# Patient Record
Sex: Male | Born: 1945 | Race: White | Hispanic: No | Marital: Married | Smoking: Never smoker
Health system: Southern US, Community
[De-identification: ages and names within clinical notes are randomized; demographics above are authoritative.]

## PROBLEM LIST (undated history)

## (undated) DIAGNOSIS — N529 Male erectile dysfunction, unspecified: Secondary | ICD-10-CM

## (undated) DIAGNOSIS — K589 Irritable bowel syndrome without diarrhea: Secondary | ICD-10-CM

## (undated) DIAGNOSIS — I10 Essential (primary) hypertension: Secondary | ICD-10-CM

## (undated) DIAGNOSIS — E785 Hyperlipidemia, unspecified: Secondary | ICD-10-CM

## (undated) DIAGNOSIS — M899 Disorder of bone, unspecified: Secondary | ICD-10-CM

## (undated) DIAGNOSIS — F411 Generalized anxiety disorder: Secondary | ICD-10-CM

## (undated) DIAGNOSIS — M949 Disorder of cartilage, unspecified: Secondary | ICD-10-CM

## (undated) DIAGNOSIS — J383 Other diseases of vocal cords: Secondary | ICD-10-CM

## (undated) DIAGNOSIS — D126 Benign neoplasm of colon, unspecified: Secondary | ICD-10-CM

## (undated) DIAGNOSIS — K219 Gastro-esophageal reflux disease without esophagitis: Secondary | ICD-10-CM

## (undated) HISTORY — DX: Disorder of cartilage, unspecified: M94.9

## (undated) HISTORY — DX: Hyperlipidemia, unspecified: E78.5

## (undated) HISTORY — DX: Essential (primary) hypertension: I10

## (undated) HISTORY — DX: Irritable bowel syndrome, unspecified: K58.9

## (undated) HISTORY — PX: CHOLECYSTECTOMY: SHX55

## (undated) HISTORY — PX: TONSILLECTOMY AND ADENOIDECTOMY: SUR1326

## (undated) HISTORY — DX: Generalized anxiety disorder: F41.1

## (undated) HISTORY — DX: Gastro-esophageal reflux disease without esophagitis: K21.9

## (undated) HISTORY — DX: Benign neoplasm of colon, unspecified: D12.6

## (undated) HISTORY — DX: Disorder of bone, unspecified: M89.9

## (undated) HISTORY — DX: Male erectile dysfunction, unspecified: N52.9

## (undated) HISTORY — DX: Other diseases of vocal cords: J38.3

---

## 1998-03-28 ENCOUNTER — Inpatient Hospital Stay (HOSPITAL_COMMUNITY): Admission: EM | Admit: 1998-03-28 | Discharge: 1998-04-01 | Payer: Self-pay | Admitting: Emergency Medicine

## 2001-01-02 ENCOUNTER — Encounter: Payer: Self-pay | Admitting: Pulmonary Disease

## 2001-01-02 ENCOUNTER — Ambulatory Visit (HOSPITAL_COMMUNITY): Admission: RE | Admit: 2001-01-02 | Discharge: 2001-01-02 | Payer: Self-pay | Admitting: Pulmonary Disease

## 2001-02-09 ENCOUNTER — Encounter (INDEPENDENT_AMBULATORY_CARE_PROVIDER_SITE_OTHER): Payer: Self-pay | Admitting: Specialist

## 2001-02-09 ENCOUNTER — Observation Stay (HOSPITAL_COMMUNITY): Admission: RE | Admit: 2001-02-09 | Discharge: 2001-02-10 | Payer: Self-pay | Admitting: General Surgery

## 2004-12-22 ENCOUNTER — Emergency Department (HOSPITAL_COMMUNITY): Admission: EM | Admit: 2004-12-22 | Discharge: 2004-12-23 | Payer: Self-pay | Admitting: Emergency Medicine

## 2004-12-25 ENCOUNTER — Ambulatory Visit: Payer: Self-pay | Admitting: Pulmonary Disease

## 2004-12-31 ENCOUNTER — Ambulatory Visit: Payer: Self-pay

## 2005-04-23 ENCOUNTER — Ambulatory Visit: Payer: Self-pay | Admitting: Pulmonary Disease

## 2006-06-28 ENCOUNTER — Ambulatory Visit: Payer: Self-pay | Admitting: Pulmonary Disease

## 2007-05-22 ENCOUNTER — Ambulatory Visit: Payer: Self-pay | Admitting: Pulmonary Disease

## 2007-05-23 LAB — CONVERTED CEMR LAB
ALT: 27 units/L (ref 0–53)
AST: 26 units/L (ref 0–37)
Albumin: 3.9 g/dL (ref 3.5–5.2)
Alkaline Phosphatase: 70 units/L (ref 39–117)
BUN: 10 mg/dL (ref 6–23)
Basophils Absolute: 0 10*3/uL (ref 0.0–0.1)
Basophils Relative: 0.8 % (ref 0.0–1.0)
Bilirubin Urine: NEGATIVE
Bilirubin, Direct: 0.1 mg/dL (ref 0.0–0.3)
CO2: 32 meq/L (ref 19–32)
Calcium: 9.4 mg/dL (ref 8.4–10.5)
Chloride: 106 meq/L (ref 96–112)
Cholesterol: 186 mg/dL (ref 0–200)
Creatinine, Ser: 0.7 mg/dL (ref 0.4–1.5)
Eosinophils Absolute: 0.2 10*3/uL (ref 0.0–0.6)
Eosinophils Relative: 4.5 % (ref 0.0–5.0)
GFR calc Af Amer: 149 mL/min
GFR calc non Af Amer: 123 mL/min
Glucose, Bld: 99 mg/dL (ref 70–99)
HCT: 38.7 % — ABNORMAL LOW (ref 39.0–52.0)
HDL: 40.4 mg/dL (ref >39.0)
Hemoglobin, Urine: NEGATIVE
Hemoglobin: 13.6 g/dL (ref 13.0–17.0)
Ketones, ur: NEGATIVE mg/dL
LDL Cholesterol: 120 mg/dL — ABNORMAL HIGH (ref 0–99)
Leukocytes, UA: NEGATIVE
Lymphocytes Relative: 26.4 % (ref 12.0–46.0)
MCHC: 35.2 g/dL (ref 30.0–36.0)
MCV: 87.9 fL (ref 78.0–100.0)
Monocytes Absolute: 0.4 10*3/uL (ref 0.2–0.7)
Monocytes Relative: 7.2 % (ref 3.0–11.0)
Neutro Abs: 3.4 10*3/uL (ref 1.4–7.7)
Neutrophils Relative %: 61.1 % (ref 43.0–77.0)
Nitrite: NEGATIVE
PSA: 0.72 ng/mL (ref 0.10–4.00)
Platelets: 308 10*3/uL (ref 150–400)
Potassium: 4.3 meq/L (ref 3.5–5.1)
RBC: 4.4 M/uL (ref 4.22–5.81)
RDW: 12.5 % (ref 11.5–14.6)
Sodium: 142 meq/L (ref 135–145)
Specific Gravity, Urine: 1.015 (ref 1.000–1.03)
TSH: 0.68 microintl units/mL (ref 0.35–5.50)
Total Bilirubin: 0.7 mg/dL (ref 0.3–1.2)
Total CHOL/HDL Ratio: 4.6
Total Protein, Urine: NEGATIVE mg/dL
Total Protein: 7.1 g/dL (ref 6.0–8.3)
Triglycerides: 129 mg/dL (ref 0–149)
Urine Glucose: NEGATIVE mg/dL
Urobilinogen, UA: 0.2 (ref 0.0–1.0)
VLDL: 26 mg/dL (ref 0–40)
WBC: 5.4 10*3/uL (ref 4.5–10.5)
pH: 7 (ref 5.0–8.0)

## 2008-04-15 DIAGNOSIS — K219 Gastro-esophageal reflux disease without esophagitis: Secondary | ICD-10-CM | POA: Insufficient documentation

## 2008-04-15 DIAGNOSIS — K589 Irritable bowel syndrome without diarrhea: Secondary | ICD-10-CM | POA: Insufficient documentation

## 2008-04-15 DIAGNOSIS — F411 Generalized anxiety disorder: Secondary | ICD-10-CM | POA: Insufficient documentation

## 2008-04-15 DIAGNOSIS — I10 Essential (primary) hypertension: Secondary | ICD-10-CM | POA: Insufficient documentation

## 2008-04-15 DIAGNOSIS — D126 Benign neoplasm of colon, unspecified: Secondary | ICD-10-CM | POA: Insufficient documentation

## 2008-04-16 ENCOUNTER — Ambulatory Visit: Payer: Self-pay | Admitting: Pulmonary Disease

## 2008-04-17 DIAGNOSIS — J383 Other diseases of vocal cords: Secondary | ICD-10-CM | POA: Insufficient documentation

## 2008-08-16 ENCOUNTER — Telehealth: Payer: Self-pay | Admitting: Pulmonary Disease

## 2008-08-28 ENCOUNTER — Telehealth (INDEPENDENT_AMBULATORY_CARE_PROVIDER_SITE_OTHER): Payer: Self-pay | Admitting: *Deleted

## 2008-09-05 ENCOUNTER — Encounter: Payer: Self-pay | Admitting: Pulmonary Disease

## 2008-09-10 ENCOUNTER — Encounter: Payer: Self-pay | Admitting: Pulmonary Disease

## 2008-10-01 ENCOUNTER — Telehealth: Payer: Self-pay | Admitting: Pulmonary Disease

## 2008-10-02 ENCOUNTER — Telehealth: Payer: Self-pay | Admitting: Gastroenterology

## 2008-10-03 ENCOUNTER — Encounter: Payer: Self-pay | Admitting: Pulmonary Disease

## 2008-10-18 IMAGING — CR DG CHEST 2V
2 series · 2 of 2 positions shown · non-contrast
Comparison: Scanned chest radiographs 05/29/2003.

CHEST - 2 VIEW
CLINICAL DATA: Cough.

[view not recorded (1 of 2)]
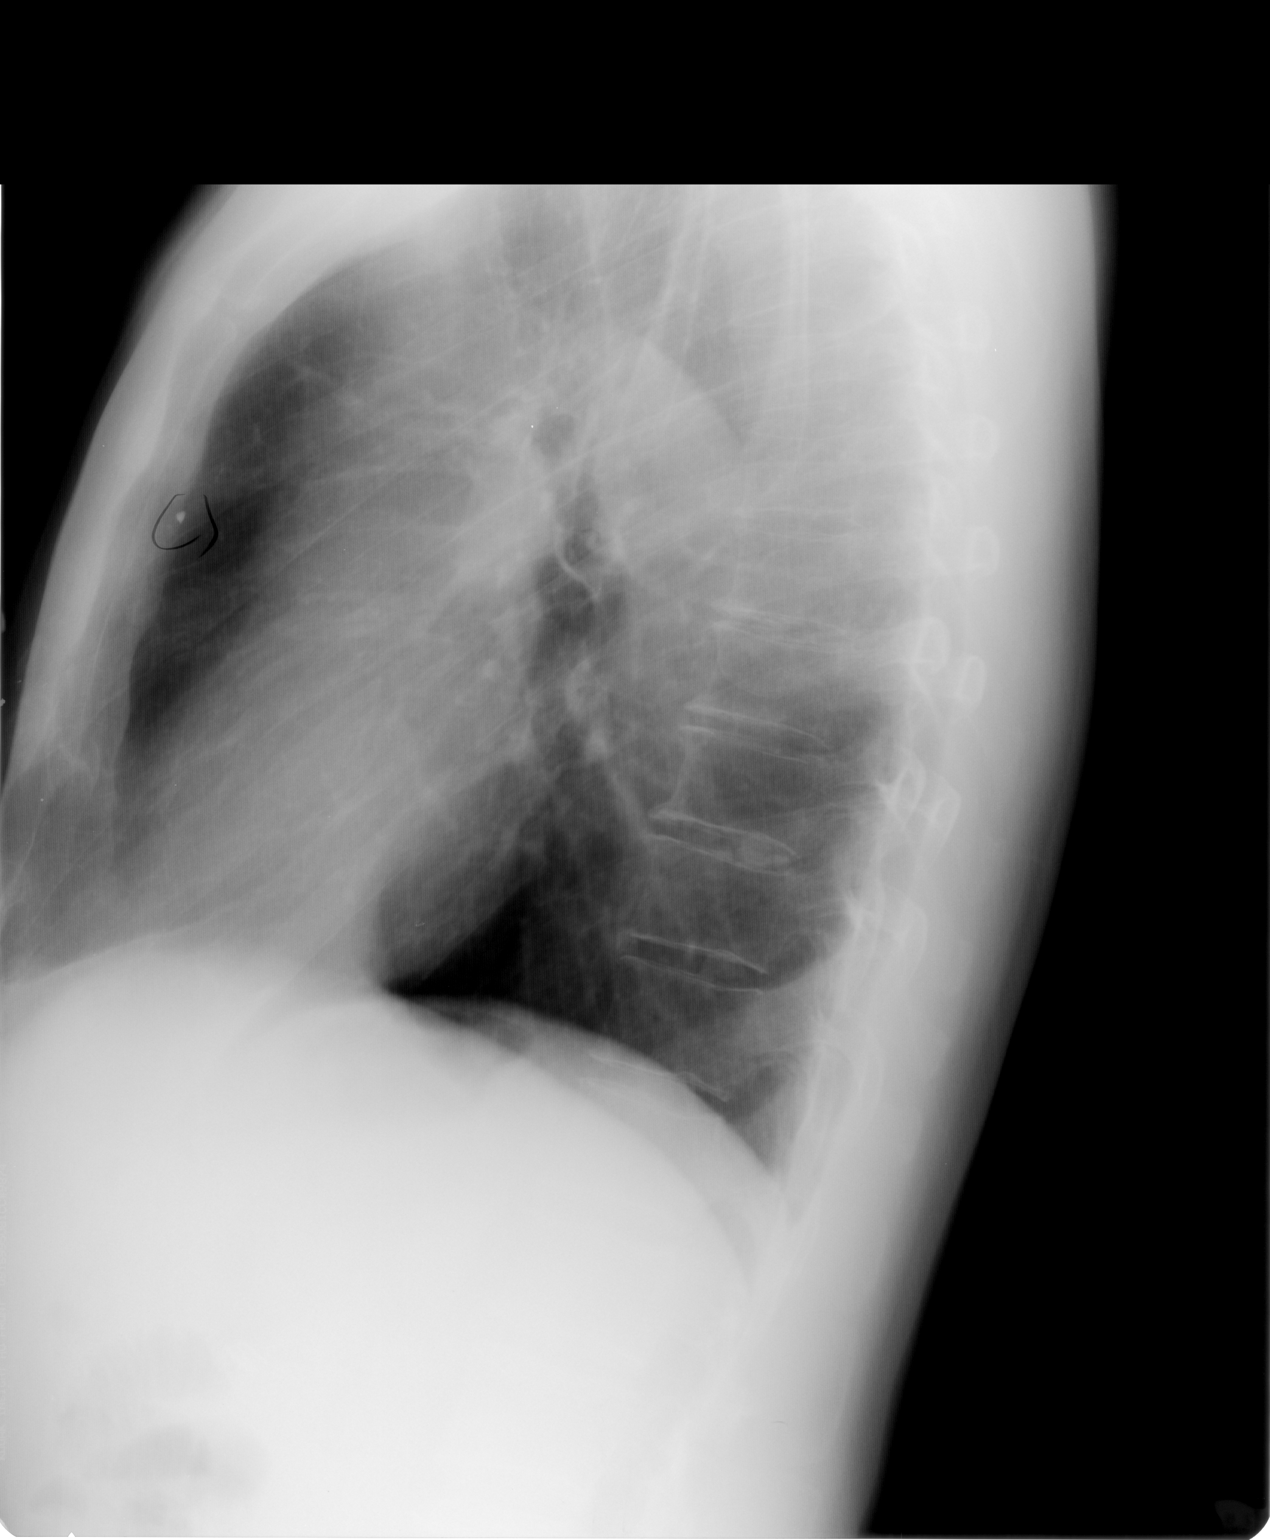

[view not recorded (2 of 2)]
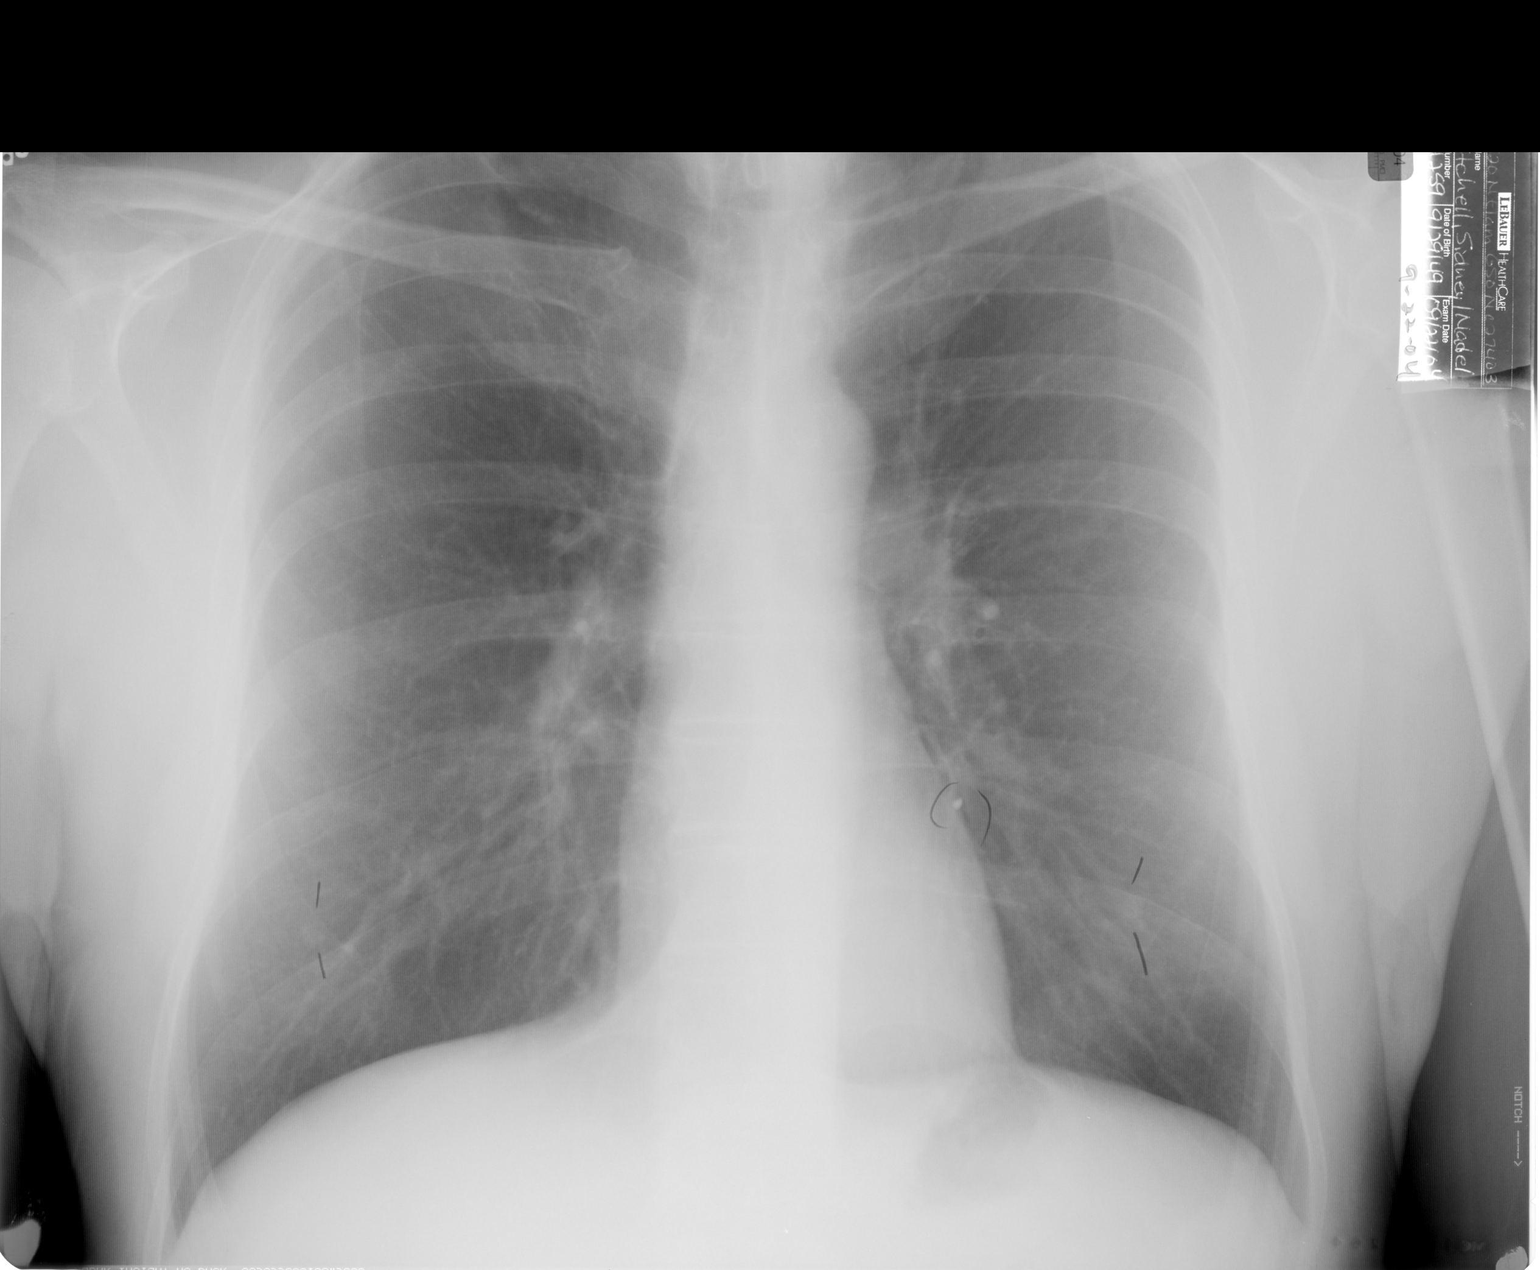

[2 of 2 positions shown; findings below may reference images not displayed]

FINDINGS: Hyperinflation suggesting emphysematous change. Prominent bilateral
nipple shadows are present, as were seen on the prior exam. Punctate
calcification noted adjacent to left heart border, consistent with calcified
granuloma. New airspace disease or edema is identified. Mid to lower thoracic
spine disc calcifications.
**********************************************************************
IMPRESSION

1. No active cardiopulmonary disease.
2. Hyperinflation suggesting emphysematous change.
3. Old granulomatous disease and prominent nipple shadows. 
**********************************************************************

## 2008-11-26 ENCOUNTER — Encounter: Payer: Self-pay | Admitting: Pulmonary Disease

## 2008-12-09 ENCOUNTER — Telehealth: Payer: Self-pay | Admitting: Pulmonary Disease

## 2008-12-11 ENCOUNTER — Encounter: Payer: Self-pay | Admitting: Pulmonary Disease

## 2008-12-31 ENCOUNTER — Encounter: Payer: Self-pay | Admitting: Pulmonary Disease

## 2009-02-04 HISTORY — PX: HEMORROIDECTOMY: SUR656

## 2009-03-27 ENCOUNTER — Encounter: Payer: Self-pay | Admitting: Pulmonary Disease

## 2009-04-07 ENCOUNTER — Telehealth: Payer: Self-pay | Admitting: Pulmonary Disease

## 2009-09-04 ENCOUNTER — Ambulatory Visit: Payer: Self-pay | Admitting: Pulmonary Disease

## 2009-09-05 DIAGNOSIS — M949 Disorder of cartilage, unspecified: Secondary | ICD-10-CM

## 2009-09-05 DIAGNOSIS — E785 Hyperlipidemia, unspecified: Secondary | ICD-10-CM | POA: Insufficient documentation

## 2009-09-05 DIAGNOSIS — M899 Disorder of bone, unspecified: Secondary | ICD-10-CM | POA: Insufficient documentation

## 2009-09-05 DIAGNOSIS — N529 Male erectile dysfunction, unspecified: Secondary | ICD-10-CM

## 2009-09-08 ENCOUNTER — Ambulatory Visit: Payer: Self-pay | Admitting: Pulmonary Disease

## 2009-09-08 LAB — CONVERTED CEMR LAB: Vit D, 25-Hydroxy: 48 ng/mL (ref 30–89)

## 2009-09-16 LAB — CONVERTED CEMR LAB
ALT: 34 units/L (ref 0–53)
AST: 28 units/L (ref 0–37)
Albumin: 3.8 g/dL (ref 3.5–5.2)
Alkaline Phosphatase: 61 units/L (ref 39–117)
BUN: 13 mg/dL (ref 6–23)
Basophils Absolute: 0.1 10*3/uL (ref 0.0–0.1)
Basophils Relative: 0.9 % (ref 0.0–3.0)
Bilirubin, Direct: 0.1 mg/dL (ref 0.0–0.3)
CO2: 30 meq/L (ref 19–32)
Calcium: 9.5 mg/dL (ref 8.4–10.5)
Chloride: 108 meq/L (ref 96–112)
Cholesterol: 146 mg/dL (ref 0–200)
Creatinine, Ser: 0.8 mg/dL (ref 0.4–1.5)
Eosinophils Absolute: 0.4 10*3/uL (ref 0.0–0.7)
Eosinophils Relative: 6.5 % — ABNORMAL HIGH (ref 0.0–5.0)
GFR calc non Af Amer: 104.36 mL/min (ref >60)
Glucose, Bld: 102 mg/dL — ABNORMAL HIGH (ref 70–99)
HCT: 40.4 % (ref 39.0–52.0)
HDL: 35.7 mg/dL — ABNORMAL LOW (ref >39.00)
Hemoglobin: 13.7 g/dL (ref 13.0–17.0)
LDL Cholesterol: 89 mg/dL (ref 0–99)
Lymphocytes Relative: 26.3 % (ref 12.0–46.0)
Lymphs Abs: 1.5 10*3/uL (ref 0.7–4.0)
MCHC: 34 g/dL (ref 30.0–36.0)
MCV: 91.3 fL (ref 78.0–100.0)
Monocytes Absolute: 0.4 10*3/uL (ref 0.1–1.0)
Monocytes Relative: 7.5 % (ref 3.0–12.0)
Neutro Abs: 3.2 10*3/uL (ref 1.4–7.7)
Neutrophils Relative %: 58.8 % (ref 43.0–77.0)
PSA: 0.65 ng/mL (ref 0.10–4.00)
Platelets: 252 10*3/uL (ref 150.0–400.0)
Potassium: 4.4 meq/L (ref 3.5–5.1)
RBC: 4.42 M/uL (ref 4.22–5.81)
RDW: 12.5 % (ref 11.5–14.6)
Sodium: 143 meq/L (ref 135–145)
TSH: 0.74 microintl units/mL (ref 0.35–5.50)
Testosterone: 415.21 ng/dL (ref 350.00–890.00)
Total Bilirubin: 0.8 mg/dL (ref 0.3–1.2)
Total CHOL/HDL Ratio: 4
Total Protein: 6.9 g/dL (ref 6.0–8.3)
Triglycerides: 109 mg/dL (ref 0.0–149.0)
VLDL: 21.8 mg/dL (ref 0.0–40.0)
WBC: 5.6 10*3/uL (ref 4.5–10.5)

## 2009-12-30 ENCOUNTER — Encounter: Payer: Self-pay | Admitting: Pulmonary Disease

## 2010-05-12 ENCOUNTER — Encounter: Payer: Self-pay | Admitting: Pulmonary Disease

## 2010-10-06 NOTE — Consult Note (Signed)
Summary: Otolaryngology/[facility]  Otolaryngology/[facility]   Imported By: Sherian Rein 01/09/2010 09:31:37  _____________________________________________________________________  External Attachment:    Type:   Image     Comment:   External Document

## 2010-10-06 NOTE — Letter (Signed)
Summary: [facility]   Imported By: Lester Aucilla 05/19/2010 11:04:33  _____________________________________________________________________  External Attachment:    Type:   Image     Comment:   External Document

## 2010-10-06 NOTE — Therapy (Signed)
Summary: [facility]  [facility]   Imported By: Lanelle Bal 09/11/2009 13:37:21  _____________________________________________________________________  External Attachment:    Type:   Image     Comment:   External Document

## 2010-12-02 ENCOUNTER — Other Ambulatory Visit: Payer: Self-pay | Admitting: *Deleted

## 2010-12-02 ENCOUNTER — Telehealth: Payer: Self-pay | Admitting: Pulmonary Disease

## 2010-12-02 NOTE — Telephone Encounter (Signed)
Called and spoke with pt's spouse.  She states that she feels like pt may have hernia in his lower abdomen.  She states that the area is "bulging" sometimes and is painful.  Does not occur all the time.  No other complaints. Appt with TP sched for 10 am tomorrow. I advised UC or ER sooner if needed.

## 2010-12-02 NOTE — Telephone Encounter (Signed)
Atenolol DENIED. Patient needs an appointment. Last OV w/ SN 09/04/2009.

## 2010-12-03 ENCOUNTER — Encounter: Payer: Self-pay | Admitting: Adult Health

## 2010-12-03 ENCOUNTER — Ambulatory Visit (INDEPENDENT_AMBULATORY_CARE_PROVIDER_SITE_OTHER): Payer: 59 | Admitting: Adult Health

## 2010-12-03 VITALS — BP 114/74 | HR 50 | Temp 97.4°F | Ht 71.0 in | Wt 186.4 lb

## 2010-12-03 DIAGNOSIS — K4090 Unilateral inguinal hernia, without obstruction or gangrene, not specified as recurrent: Secondary | ICD-10-CM | POA: Insufficient documentation

## 2010-12-03 NOTE — Patient Instructions (Signed)
We are referring you to a surgeon to evaluate your left inguinal hernia follow up Dr. Kriste Basque  In 3 months for follow up

## 2010-12-03 NOTE — Progress Notes (Signed)
  Subjective:    Patient ID: Thomas Ward, male    DOB: May 01, 1946, 65 y.o.   MRN: [id number]  HPI Comments: 65 yo Male with known hx of HTN, IBS, GERD   12/03/2010 ACUTE OV- Presents with complaints of ? right femoral hernia x1months - reports has become larger since onset. c/o discomfort, but no pain. Noticed some discomfort and saw saw swelling along left groin. Over last 2 months has seen it get bigger.  Wants a referral to Gen Surgeon at [facility]. He did his previous hemorrhoid sugery.  No pain, dysuria, no testicular pain,. He does a lot of heavy lifting. Has started new exercise regimen for last 2 months.       Review of Systems   Constitutional:   No  weight loss, night sweats,  Fevers, chills, fatigue, lassitude. HEENT:   No headaches,  Difficulty swallowing,   CV:  No chest pain,  Orthopnea, PND, swelling in lower extremities, anasarca, dizziness, palpitations  GI  No heartburn, indigestion, abdominal pain, nausea, vomiting, diarrhea, change in bowel habits, loss of appetite  Resp: No shortness of breath with exertion or at rest.  No excess mucus, no productive cough,  No non-productive cough,  No coughing up of blood.  No change in color of mucus.  No wheezing.  No chest wall deformity  Skin: no rash or lesions.  GU: no dysuria, change in color of urine, no urgency or frequency.  No flank pain. postive pressure in left groin, swelling -intermittent w/ bending.   MS:  No joint pain or swelling.  No decreased range of motion.  No back pain.  Psych:  No change in mood or affect. No depression or anxiety.  No memory loss.   Objective:   Physical Exam Gen: Pleasant, well-nourished, in no distress,  normal affect  ENT: No lesions,  mouth clear,  oropharynx clear, no postnasal drip  Neck: No JVD, no TMG, no carotid bruits  Lungs: No use of accessory muscles, no dullness to percussion, clear without rales or rhonchi  Cardiovascular: RRR, heart  sounds normal, no murmur or gallops, no peripheral edema  Abdomen: soft and NT, no HSM,  BS normal Right groin no palpable masses noted, pulses intact w/o bruits.  Left groin with palpable soft mass that is reducible  No testicular nodules/masses noted, no swelling. Palpable hernia in left ingunial canal.   Musculoskeletal: No deformities, no cyanosis or clubbing  Neuro: alert, non focal  Skin: Warm, no lesions or rashes         Assessment & Plan:

## 2010-12-03 NOTE — Assessment & Plan Note (Signed)
Left inguinal hernia - non-incarcerated  -plan Refer to gen surgery for eval.  Avoid heavy lifting or straining.

## 2010-12-04 ENCOUNTER — Ambulatory Visit: Payer: Self-pay | Admitting: Adult Health

## 2010-12-28 ENCOUNTER — Other Ambulatory Visit: Payer: Self-pay | Admitting: *Deleted

## 2010-12-28 MED ORDER — AMLODIPINE BESYLATE 5 MG PO TABS: 5.0000 mg | ORAL_TABLET | Freq: Every day | ORAL | Status: DC

## 2010-12-28 MED ORDER — ATENOLOL 50 MG PO TABS: 50.0000 mg | ORAL_TABLET | Freq: Every day | ORAL | Status: DC

## 2011-01-20 ENCOUNTER — Encounter: Payer: Self-pay | Admitting: Pulmonary Disease

## 2011-01-22 NOTE — H&P (Signed)
[facility]  Patient:    Thomas Ward, Thomas Ward                    MRN: [id number] Adm. Date:  [id number] Attending:  Arlis Porta CC:         Lonzo Cloud. Kriste Basque, M.D. [facility]                         History and Physical  REASON FOR ADMISSION:  Elective cholecystectomy.  HISTORY OF PRESENT ILLNESS:  Thomas Ward is a 65 year old male who I saw in the office on January 02, 2001.  At that time, he had had six days of headache, lethargy, fever and chills and crampy abdominal pain with nausea and then diarrhea; it occurred after he ate a meal at [facility]  He was seen by Dr. Lonzo Cloud. Kriste Basque and started on Nexium and he also underwent an abdominal ultrasound which demonstrated a 1-cm gallstone.  At that time, the current acute episode he was having seemed to be more consistent with a gastroenteritis; however, in speaking with him, he did describe intermittent symptoms in the past of postprandial crampy right upper quadrant pain associated with some nausea.  The postprandial pain that he had had in the past was different than the current bout he was having when I saw him in the office.  He subsequently improved with some hydration.  Because he has had biliary colic and gallstones, he now presents for elective cholecystectomy. He has normal liver function tests and amylase and CBC.  The procedure and the risks including but not limited to bleeding, infection, common bile duct injury, bile leak, intestinal injury and risks of anesthesia were explained to him; we also talked about the side-effects of diarrhea.  PAST MEDICAL HISTORY:  Gastroesophageal reflux with laryngitis.  PREVIOUS OPERATIONS:  None.  ALLERGIES:  None.  MEDICATIONS:  Nexium.  SOCIAL HISTORY:  No tobacco use.  He has two alcoholic beverages a month.  FAMILY HISTORY:  No chronic illnesses in his family.  REVIEW OF SYSTEMS:  Remarkable for some laryngeal spasms.  PHYSICAL  EXAMINATION:  GENERAL:  A well-developed, well-nourished male in no acute distress, pleasant and cooperative.  EYES:  Extraocular motions intact.  Sclerae are clear.  NECK:  Supple without masses.  ABDOMEN:  Soft, nontender, nondistended.  No palpable masses.  LUNGS:  Equal breath sounds and clear to auscultation.  HEART:  Regular rate and rhythm.  EXTREMITIES:  Full range of motion.  No edema.  IMPRESSION:  Biliary colic secondary to cholelithiasis.  PLAN:  Laparoscopic cholecystectomy. DD:  02/09/01 TD:  02/10/01 Job: [id number]

## 2011-03-12 ENCOUNTER — Other Ambulatory Visit: Payer: Self-pay | Admitting: *Deleted

## 2011-03-12 MED ORDER — PANTOPRAZOLE SODIUM 40 MG PO TBEC: 40.0000 mg | DELAYED_RELEASE_TABLET | Freq: Every day | ORAL | Status: DC

## 2011-03-17 ENCOUNTER — Other Ambulatory Visit: Payer: Self-pay | Admitting: *Deleted

## 2011-03-17 MED ORDER — AMLODIPINE BESYLATE 5 MG PO TABS: ORAL_TABLET | ORAL | Status: AC

## 2011-03-17 MED ORDER — PANTOPRAZOLE SODIUM 40 MG PO TBEC: DELAYED_RELEASE_TABLET | ORAL | Status: AC

## 2011-03-17 MED ORDER — ATENOLOL 50 MG PO TABS: ORAL_TABLET | ORAL | Status: AC

## 2011-03-17 NOTE — Telephone Encounter (Signed)
Received faxed refill request for amlodipine 5mg  daily and pantoprazole 40 daily - pt requesting 90 days supply.  Pt was last seen by SN on 08/25/2009 and TP on 12/03/2010 - no pending appts.  Called, spoke with pt.  He is aware 90 day rx will be sent for each med but he will need to make OV with SN for additional refills.  He verbalized understanding of this and is requesting to call back later to schedule cpx with SN.

## 2011-03-17 NOTE — Telephone Encounter (Signed)
Pt's last OV with SN 09/04/2009 Last OV with TP 12/03/2010 No pending appts.

## 2015-06-02 ENCOUNTER — Other Ambulatory Visit: Payer: Self-pay | Admitting: Orthopedic Surgery

## 2015-06-02 ENCOUNTER — Other Ambulatory Visit: Payer: Self-pay

## 2015-06-02 ENCOUNTER — Encounter (HOSPITAL_BASED_OUTPATIENT_CLINIC_OR_DEPARTMENT_OTHER): Payer: Self-pay | Admitting: *Deleted

## 2015-06-02 ENCOUNTER — Encounter (HOSPITAL_BASED_OUTPATIENT_CLINIC_OR_DEPARTMENT_OTHER)
Admission: RE | Admit: 2015-06-02 | Discharge: 2015-06-02 | Disposition: A | Discharge status: Home / Self Care | Payer: Medicare Other | Source: Ambulatory Visit | Attending: Orthopedic Surgery | Admitting: Orthopedic Surgery

## 2015-06-02 DIAGNOSIS — Z7982 Long term (current) use of aspirin: Secondary | ICD-10-CM | POA: Diagnosis not present

## 2015-06-02 DIAGNOSIS — I10 Essential (primary) hypertension: Secondary | ICD-10-CM | POA: Diagnosis not present

## 2015-06-02 DIAGNOSIS — Z01818 Encounter for other preprocedural examination: Secondary | ICD-10-CM | POA: Insufficient documentation

## 2015-06-02 DIAGNOSIS — E785 Hyperlipidemia, unspecified: Secondary | ICD-10-CM | POA: Diagnosis not present

## 2015-06-02 DIAGNOSIS — D2112 Benign neoplasm of connective and other soft tissue of left upper limb, including shoulder: Secondary | ICD-10-CM | POA: Diagnosis not present

## 2015-06-02 DIAGNOSIS — M6028 Foreign body granuloma of soft tissue, not elsewhere classified, other site: Secondary | ICD-10-CM | POA: Diagnosis not present

## 2015-06-02 DIAGNOSIS — Z1833 Retained wood fragments: Secondary | ICD-10-CM | POA: Diagnosis not present

## 2015-06-03 ENCOUNTER — Encounter (HOSPITAL_BASED_OUTPATIENT_CLINIC_OR_DEPARTMENT_OTHER)
Admission: RE | Disposition: A | Discharge status: Home / Self Care | Payer: Self-pay | Source: Ambulatory Visit | Attending: Orthopedic Surgery

## 2015-06-03 ENCOUNTER — Ambulatory Visit (HOSPITAL_BASED_OUTPATIENT_CLINIC_OR_DEPARTMENT_OTHER): Payer: Medicare Other | Admitting: Anesthesiology

## 2015-06-03 ENCOUNTER — Ambulatory Visit (HOSPITAL_BASED_OUTPATIENT_CLINIC_OR_DEPARTMENT_OTHER)
Admission: RE | Admit: 2015-06-03 | Discharge: 2015-06-03 | Disposition: A | Discharge status: Home / Self Care | Payer: Medicare Other | Source: Ambulatory Visit | Attending: Orthopedic Surgery | Admitting: Orthopedic Surgery

## 2015-06-03 ENCOUNTER — Encounter (HOSPITAL_BASED_OUTPATIENT_CLINIC_OR_DEPARTMENT_OTHER): Payer: Self-pay | Admitting: Orthopedic Surgery

## 2015-06-03 DIAGNOSIS — Z1833 Retained wood fragments: Secondary | ICD-10-CM | POA: Diagnosis not present

## 2015-06-03 DIAGNOSIS — E785 Hyperlipidemia, unspecified: Secondary | ICD-10-CM | POA: Insufficient documentation

## 2015-06-03 DIAGNOSIS — M6028 Foreign body granuloma of soft tissue, not elsewhere classified, other site: Secondary | ICD-10-CM | POA: Insufficient documentation

## 2015-06-03 DIAGNOSIS — Z7982 Long term (current) use of aspirin: Secondary | ICD-10-CM | POA: Insufficient documentation

## 2015-06-03 DIAGNOSIS — I10 Essential (primary) hypertension: Secondary | ICD-10-CM | POA: Insufficient documentation

## 2015-06-03 HISTORY — PX: MASS EXCISION: SHX2000

## 2015-06-03 SURGERY — EXCISION MASS
Anesthesia: Regional | Site: Finger | Laterality: Left

## 2015-06-03 MED ORDER — LIDOCAINE HCL (CARDIAC) 20 MG/ML IV SOLN
INTRAVENOUS | Status: DC | PRN
  Administered 2015-06-03: 30 mg via INTRAVENOUS

## 2015-06-03 MED ORDER — CEFAZOLIN SODIUM-DEXTROSE 2-3 GM-% IV SOLR
INTRAVENOUS | Status: AC
  Filled 2015-06-03: qty 50

## 2015-06-03 MED ORDER — FENTANYL CITRATE (PF) 100 MCG/2ML IJ SOLN
INTRAMUSCULAR | Status: DC | PRN
  Administered 2015-06-03: 50 ug via INTRAVENOUS

## 2015-06-03 MED ORDER — LACTATED RINGERS IV SOLN
INTRAVENOUS | Status: DC | PRN
  Administered 2015-06-03 (×2): via INTRAVENOUS

## 2015-06-03 MED ORDER — BUPIVACAINE HCL (PF) 0.25 % IJ SOLN
INTRAMUSCULAR | Status: DC | PRN
  Administered 2015-06-03: 3 mL

## 2015-06-03 MED ORDER — MIDAZOLAM HCL 2 MG/2ML IJ SOLN: 0.5000 mg | Freq: Once | INTRAMUSCULAR | Status: DC | PRN

## 2015-06-03 MED ORDER — CHLORHEXIDINE GLUCONATE 4 % EX LIQD: 60.0000 mL | Freq: Once | CUTANEOUS | Status: DC

## 2015-06-03 MED ORDER — MIDAZOLAM HCL 5 MG/5ML IJ SOLN
INTRAMUSCULAR | Status: DC | PRN
  Administered 2015-06-03: 1 mg via INTRAVENOUS

## 2015-06-03 MED ORDER — CEFAZOLIN SODIUM-DEXTROSE 2-3 GM-% IV SOLR
2.0000 g | INTRAVENOUS | Status: AC
  Administered 2015-06-03: 2 g via INTRAVENOUS

## 2015-06-03 MED ORDER — TRAMADOL HCL 50 MG PO TABS: 50.0000 mg | ORAL_TABLET | Freq: Four times a day (QID) | ORAL | Status: AC | PRN

## 2015-06-03 MED ORDER — FENTANYL CITRATE (PF) 100 MCG/2ML IJ SOLN
INTRAMUSCULAR | Status: AC
  Filled 2015-06-03: qty 4

## 2015-06-03 MED ORDER — CEPHALEXIN 250 MG PO CAPS: 250.0000 mg | ORAL_CAPSULE | Freq: Four times a day (QID) | ORAL | Status: AC

## 2015-06-03 MED ORDER — MEPERIDINE HCL 25 MG/ML IJ SOLN: 6.2500 mg | INTRAMUSCULAR | Status: DC | PRN

## 2015-06-03 MED ORDER — PROMETHAZINE HCL 25 MG/ML IJ SOLN: 6.2500 mg | INTRAMUSCULAR | Status: DC | PRN

## 2015-06-03 MED ORDER — PROPOFOL 10 MG/ML IV BOLUS
INTRAVENOUS | Status: DC | PRN
  Administered 2015-06-03 (×2): 20 mg via INTRAVENOUS

## 2015-06-03 MED ORDER — MIDAZOLAM HCL 2 MG/2ML IJ SOLN
INTRAMUSCULAR | Status: AC
  Filled 2015-06-03: qty 4

## 2015-06-03 MED ORDER — GLYCOPYRROLATE 0.2 MG/ML IJ SOLN
INTRAMUSCULAR | Status: AC
  Filled 2015-06-03: qty 1

## 2015-06-03 MED ORDER — ONDANSETRON HCL 4 MG/2ML IJ SOLN
INTRAMUSCULAR | Status: DC | PRN
  Administered 2015-06-03: 4 mg via INTRAVENOUS

## 2015-06-03 MED ORDER — LIDOCAINE HCL (PF) 0.5 % IJ SOLN
INTRAMUSCULAR | Status: DC | PRN
  Administered 2015-06-03: 30 mL via INTRAVENOUS

## 2015-06-03 MED ORDER — FENTANYL CITRATE (PF) 100 MCG/2ML IJ SOLN: 25.0000 ug | INTRAMUSCULAR | Status: DC | PRN

## 2015-06-03 SURGICAL SUPPLY — 47 items
BANDAGE COBAN STERILE 2 (GAUZE/BANDAGES/DRESSINGS) IMPLANT
BLADE SURG 15 STRL LF DISP TIS (BLADE) ×1 IMPLANT
BLADE SURG 15 STRL SS (BLADE) ×3
BNDG CMPR 9X4 STRL LF SNTH (GAUZE/BANDAGES/DRESSINGS)
BNDG COHESIVE 1X5 TAN STRL LF (GAUZE/BANDAGES/DRESSINGS) ×2 IMPLANT
BNDG COHESIVE 3X5 TAN STRL LF (GAUZE/BANDAGES/DRESSINGS) IMPLANT
BNDG ESMARK 4X9 LF (GAUZE/BANDAGES/DRESSINGS) IMPLANT
BNDG GAUZE ELAST 4 BULKY (GAUZE/BANDAGES/DRESSINGS) IMPLANT
CHLORAPREP W/TINT 26ML (MISCELLANEOUS) ×3 IMPLANT
CORDS BIPOLAR (ELECTRODE) ×3 IMPLANT
COVER BACK TABLE 60X90IN (DRAPES) ×3 IMPLANT
COVER MAYO STAND STRL (DRAPES) ×3 IMPLANT
CUFF TOURNIQUET SINGLE 18IN (TOURNIQUET CUFF) ×2 IMPLANT
DECANTER SPIKE VIAL GLASS SM (MISCELLANEOUS) IMPLANT
DRAIN PENROSE 1/2X12 LTX STRL (WOUND CARE) IMPLANT
DRAPE EXTREMITY T 121X128X90 (DRAPE) ×3 IMPLANT
DRAPE SURG 17X23 STRL (DRAPES) ×3 IMPLANT
GAUZE SPONGE 4X4 12PLY STRL (GAUZE/BANDAGES/DRESSINGS) ×3 IMPLANT
GAUZE XEROFORM 1X8 LF (GAUZE/BANDAGES/DRESSINGS) ×3 IMPLANT
GLOVE BIOGEL PI IND STRL 7.0 (GLOVE) IMPLANT
GLOVE BIOGEL PI IND STRL 8.5 (GLOVE) ×1 IMPLANT
GLOVE BIOGEL PI INDICATOR 7.0 (GLOVE) ×4
GLOVE BIOGEL PI INDICATOR 8.5 (GLOVE) ×2
GLOVE ECLIPSE 6.5 STRL STRAW (GLOVE) ×2 IMPLANT
GLOVE SURG ORTHO 8.0 STRL STRW (GLOVE) ×3 IMPLANT
GOWN STRL REUS W/ TWL LRG LVL3 (GOWN DISPOSABLE) ×1 IMPLANT
GOWN STRL REUS W/TWL LRG LVL3 (GOWN DISPOSABLE) ×3
GOWN STRL REUS W/TWL XL LVL3 (GOWN DISPOSABLE) ×3 IMPLANT
NDL PRECISIONGLIDE 27X1.5 (NEEDLE) IMPLANT
NDL SAFETY ECLIPSE 18X1.5 (NEEDLE) IMPLANT
NEEDLE HYPO 18GX1.5 SHARP (NEEDLE)
NEEDLE PRECISIONGLIDE 27X1.5 (NEEDLE) ×3 IMPLANT
NS IRRIG 1000ML POUR BTL (IV SOLUTION) ×3 IMPLANT
PACK BASIN DAY SURGERY FS (CUSTOM PROCEDURE TRAY) ×3 IMPLANT
PAD CAST 3X4 CTTN HI CHSV (CAST SUPPLIES) IMPLANT
PADDING CAST COTTON 3X4 STRL (CAST SUPPLIES)
SPLINT PLASTER CAST XFAST 3X15 (CAST SUPPLIES) IMPLANT
SPLINT PLASTER XTRA FASTSET 3X (CAST SUPPLIES)
STOCKINETTE 4X48 STRL (DRAPES) ×3 IMPLANT
SUT ETHILON 4 0 PS 2 18 (SUTURE) ×3 IMPLANT
SUT VIC AB 4-0 P2 18 (SUTURE) IMPLANT
SWAB COLLECTION DEVICE MRSA (MISCELLANEOUS) ×2 IMPLANT
SYR BULB 3OZ (MISCELLANEOUS) ×3 IMPLANT
SYR CONTROL 10ML LL (SYRINGE) ×2 IMPLANT
TOWEL OR 17X24 6PK STRL BLUE (TOWEL DISPOSABLE) ×3 IMPLANT
TUBE ANAEROBIC SPECIMEN COL (MISCELLANEOUS) ×2 IMPLANT
UNDERPAD 30X30 (UNDERPADS AND DIAPERS) ×3 IMPLANT

## 2015-06-03 NOTE — Op Note (Signed)
Dictation Number [id number]

## 2015-06-03 NOTE — H&P (Signed)
[name] is a 69 year-old right-hand dominant male who comes in complaining of a foreign body, possible splinter, palm side, left small finger, proximal phalanx.  This has been present for approximately one month.  He thinks this may be wood.  It entered from the radial and he has pain on the ulnar aspect of the proximal phalanx, volar aspect.  He complains of this being a sharp feeling of pain with swelling.  He has no history of prior injuries. He has not had any treatment for this.  He feels he may have removed the splinter. He has no history of diabetes, thyroid problems, arthritis or gout.  He is concerned about the possibility of infection.   ALLERGIES:    None.  MEDICATIONS:     Blood pressure medicine  SURGICAL HISTORY:      None.  FAMILY MEDICAL HISTORY:    Negative.  SOCIAL HISTORY:    He does not smoke or drink.  He is married.  He is retired.   REVIEW OF SYSTEMS:     Positive for skin cancer, high blood pressure, otherwise negative 14 points.   Thomas Ward is an 69 y.o. male.   Chief Complaint: mass possible foreign body left small finger HPI: see above  Past Medical History  Diagnosis Date  . Other diseases of vocal cords   . Unspecified essential hypertension   . Other and unspecified hyperlipidemia   . Esophageal reflux   . Irritable bowel syndrome   . Benign neoplasm of colon   . Impotence of organic origin   . Disorder of bone and cartilage, unspecified   . Anxiety state, unspecified     Past Surgical History  Procedure Laterality Date  . Tonsillectomy and adenoidectomy    . Cholecystectomy    . Hemorroidectomy  02-2009    Dr Thomas Ward [facility]    Family History  Problem Relation Age of Onset  . Stroke Mother   . Heart attack Mother    Social History:  reports that he has never smoked. He does not have any smokeless tobacco history on file. He reports that he drinks alcohol. He reports that he does not use illicit drugs.  Allergies:  Allergies   Allergen Reactions  . Codeine     Lightheaded, sweats    Medications Prior to Admission  Medication Sig Dispense Refill  . amLODipine (NORVASC) 5 MG tablet Take 1 tablet by mouth daily - needs appt with Dr. Lenna Ward 90 tablet 0  . aspirin 81 MG tablet Take 81 mg by mouth daily.      Marland Kitchen atenolol (TENORMIN) 50 MG tablet Take 1 tablet by mouth once daily -- PT NEEDS APPT WITH DR. NADEL 90 tablet 0  . pantoprazole (PROTONIX) 40 MG tablet Take 1 tablet by mouth daily - needs appt with Dr. Lenna Ward 90 tablet 0    No results found for this or any previous visit (from the past 48 hour(s)).  No results found.   Pertinent items are noted in HPI.  Height 5\' 11"  (1.803 m), weight 83.462 kg (184 lb).  General appearance: alert, cooperative and appears stated age Head: Normocephalic, without obvious abnormality Neck: no JVD Resp: clear to auscultation bilaterally Cardio: regular rate and rhythm, S1, S2 normal, no murmur, click, rub or gallop GI: soft, non-tender; bowel sounds normal; no masses,  no organomegaly Extremities: mass left small finger Pulses: 2+ and symmetric Skin: Skin color, texture, turgor normal. No rashes or lesions Neurologic: Grossly normal Incision/Wound: na  Assessment/Plan RADIOGRAPHS:   X-rays are negative.   DIAGNOSIS:      Soft tissue tumor, unspecified.  This may be foreign body, may be foreign body granuloma or it may be a cyst.  It does not appear to have transected artery or nerve, but he is advised there is always possibility that it has involved neurovascular bundle on ulnar aspect where it is painful for him.  It does not appear that it has hit the flexor tendons.    RECOMMENDATIONS/PLAN:   He would like to have this removed.  He is scheduled as an outpatient.  He is aware that there is no guarantee with the surgery, the possibility of infection, recurrence, injury to arteries, nerves and tendons.  This will be scheduled as an outpatient under regional  anesthesia.  KUZMA,Thomas Ward 06/03/2015, 11:50 AM

## 2015-06-03 NOTE — Transfer of Care (Signed)
Immediate Anesthesia Transfer of Care Note  Patient: Thomas Ward  Procedure(s) Performed: Procedure(s): EXCISION MASS LEFT SMALL FINGER (Left)  Patient Location: PACU  Anesthesia Type:Bier block  Level of Consciousness: awake, alert , oriented and patient cooperative  Airway & Oxygen Therapy: Patient Spontanous Breathing and Patient connected to face mask oxygen  Post-op Assessment: Report given to RN and Post -op Vital signs reviewed and stable  Post vital signs: Reviewed and stable  Last Vitals:  Filed Vitals:   06/03/15 1156  BP: 105/70  Pulse: 50  Temp: 36.7 C  Resp: 20    Complications: No apparent anesthesia complications

## 2015-06-03 NOTE — Brief Op Note (Signed)
06/03/2015  1:41 PM  PATIENT:  Bess Kinds  69 y.o. male  PRE-OPERATIVE DIAGNOSIS:  mass left small finger D21.12  POST-OPERATIVE DIAGNOSIS:  mass left small finger D21.12  PROCEDURE:  Procedure(s): EXCISION MASS LEFT SMALL FINGER (Left)  SURGEON:  Surgeon(s) and Role:    * Daryll Brod, MD - Primary  PHYSICIAN ASSISTANT:   ASSISTANTS: none   ANESTHESIA:  Regional and local  EBL:  Total I/O In: 1100 [I.V.:1100] Out: -   BLOOD ADMINISTERED:none  DRAINS: none   LOCAL MEDICATIONS USED:  BUPIVICAINE   SPECIMEN:  Excision  DISPOSITION OF SPECIMEN:  PATHOLOGY  COUNTS:  correct  TOURNIQUET:   Total Tourniquet Time Documented: Forearm (Left) - 21 minutes Total: Forearm (Left) - 21 minutes   DICTATION: .Other Dictation: Dictation Number [id number]  PLAN OF CARE: Discharge to home after PACU  PATIENT DISPOSITION:  PACU - hemodynamically stable.

## 2015-06-03 NOTE — Anesthesia Procedure Notes (Addendum)
Anesthesia Regional Block:  Bier block (IV Regional)  Pre-Anesthetic Checklist: ,, timeout performed, Correct Patient, Correct Site, Correct Laterality, Correct Procedure,, site marked, surgical consent,, at surgeon's request Needles:  Injection technique: Single-shot  Needle Type: Other      Needle Gauge: 20 and 20 G    Additional Needles: Bier block (IV Regional) Narrative:   Performed by: Personally    Procedure Name: MAC Date/Time: 06/03/2015 1:10 PM Performed by: Terryn Rosenkranz D Pre-anesthesia Checklist: Patient identified, Emergency Drugs available, Suction available, Patient being monitored and Timeout performed Patient Re-evaluated:Patient Re-evaluated prior to inductionOxygen Delivery Method: Simple face mask

## 2015-06-03 NOTE — Anesthesia Postprocedure Evaluation (Signed)
  Anesthesia Post-op Note  Patient: Thomas Ward  Procedure(s) Performed: Procedure(s): EXCISION MASS LEFT SMALL FINGER (Left)  Patient Location: PACU  Anesthesia Type:Bier block  Level of Consciousness: awake, alert , oriented and patient cooperative  Airway and Oxygen Therapy: Patient Spontanous Breathing  Post-op Pain: none  Post-op Assessment: Post-op Vital signs reviewed, Patient's Cardiovascular Status Stable, Respiratory Function Stable, Patent Airway, No signs of Nausea or vomiting and Pain level controlled              Post-op Vital Signs: Reviewed and stable  Last Vitals:  Filed Vitals:   06/03/15 1500  BP: 129/68  Pulse: 51  Temp: 36.5 C  Resp: 18    Complications: No apparent anesthesia complications

## 2015-06-03 NOTE — Anesthesia Preprocedure Evaluation (Addendum)
Anesthesia Evaluation  Patient identified by MRN, date of birth, ID band Patient awake    Reviewed: Allergy & Precautions, NPO status , Patient's Chart, lab work & pertinent test results  History of Anesthesia Complications Negative for: history of anesthetic complications  Airway Mallampati: I  TM Distance: >3 FB Neck ROM: Full    Dental  (+) Dental Advisory Given   Pulmonary neg pulmonary ROS,    breath sounds clear to auscultation       Cardiovascular hypertension, Pt. on medications and Pt. on home beta blockers (-) angina Rhythm:Regular Rate:Normal  '10 stress test: no ischemia   Neuro/Psych Anxiety negative neurological ROS     GI/Hepatic Neg liver ROS, GERD  Medicated and Controlled,  Endo/Other  negative endocrine ROS  Renal/GU negative Renal ROS     Musculoskeletal   Abdominal   Peds  Hematology negative hematology ROS (+)   Anesthesia Other Findings   Reproductive/Obstetrics                           Anesthesia Physical Anesthesia Plan  ASA: II  Anesthesia Plan: Bier Block   Post-op Pain Management:    Induction:   Airway Management Planned: Simple Face Mask and Natural Airway  Additional Equipment:   Intra-op Plan:   Post-operative Plan:   Informed Consent: I have reviewed the patients History and Physical, chart, labs and discussed the procedure including the risks, benefits and alternatives for the proposed anesthesia with the patient or authorized representative who has indicated his/her understanding and acceptance.   Dental advisory given  Plan Discussed with: Surgeon and CRNA  Anesthesia Plan Comments: (Plan routine monitors, IV regional )        Anesthesia Quick Evaluation

## 2015-06-03 NOTE — Discharge Instructions (Addendum)

## 2015-06-04 ENCOUNTER — Encounter (HOSPITAL_BASED_OUTPATIENT_CLINIC_OR_DEPARTMENT_OTHER): Payer: Self-pay | Admitting: Orthopedic Surgery

## 2015-06-04 NOTE — Op Note (Signed)
[name], Thomas Ward             ACCOUNT NO.:  [id number]  MEDICAL RECORD NO.:  [id number]  LOCATION:                                 FACILITY:  PHYSICIAN:  Daryll Brod, M.D.            DATE OF BIRTH:  DATE OF PROCEDURE:  06/03/2015 DATE OF DISCHARGE:                              OPERATIVE REPORT   PREOPERATIVE DIAGNOSIS:  Foreign body granuloma, foreign body, left small finger.  POSTOPERATIVE DIAGNOSIS:  Foreign body granuloma, foreign body, left small finger.  OPERATION:  Removal of foreign body granuloma, foreign body, left small finger.  SURGEON:  Daryll Brod, MD.  ANESTHESIA:  Forearm IV regional with local infiltration.  ANESTHESIOLOGISTGlennon Mac.  HISTORY:  The patient is a 69 year old male, who suffered an injury to his left small finger a month ago.  He has developed a mass over the area of the puncture wound.  He states that he attempted to remove material, removed what he felt was the splinter, was concerned that there may be retained __________ on the proximal phalanx of his left small finger.  He is desirous having this excised, fear of having an infection.  Pre, peri, and postoperative course have been discussed along with risks and complications.  He is scheduled for excision of foreign body granuloma of left small finger.  He is aware that there is no guarantee with the surgery; possibility of infection; recurrence of injury to arteries, nerves, tendons; incomplete relief of symptoms; possibility of retained material.  Preoperative area, the patient is seen, the extremity marked by both the patient and surgeon.  Antibiotic given.  PROCEDURE IN DETAIL:  The patient was brought to the operating room, where a forearm-based IV regional anesthetic was carried out without difficulty.  He was prepped using ChloraPrep in supine position.  We left the arm free.  A 3-minute dry time was allowed.  Time-out taken, confirming the patient and procedure.  An oblique  incision was made after possibility of extension proximally and distally.  There was a Brunner incision carried down through the subcutaneous tissue.  A mass was immediately encountered.  This was opened.  A piece of wood measuring approximately 1.5 cm to 2 cm in length was removed.  The granuloma was then excised taking care to protect neurovascular bundles. Bleeders were electrocauterized.  Culture was taken to be certain that there was not an infectious process.  The wound was copiously irrigated with saline, and the skin closed with interrupted 4-0 nylon sutures.  A local infiltration of metacarpal block, was given 0.25% bupivacaine without epinephrine, approximately 5 mL was used.  Sterile compressive dressing to the finger was applied. Deflation of the tourniquet, all fingers pinked.  He was taken to the recovery room for observation in satisfactory condition.  He will be discharged home to return to the [facility] in 1 week on tramadol and Keflex.          ______________________________ Daryll Brod, M.D.     GK/MEDQ  D:  06/03/2015  T:  06/04/2015  Job:  [id number]

## 2015-06-06 LAB — WOUND CULTURE: Culture: NO GROWTH

## 2015-06-09 LAB — ANAEROBIC CULTURE

## 2015-07-02 LAB — FUNGUS CULTURE W SMEAR: FUNGAL SMEAR: NONE SEEN

## 2015-07-16 LAB — AFB CULTURE WITH SMEAR (NOT AT ARMC): Acid Fast Smear: NONE SEEN
